# Patient Record
Sex: Female | Born: 1990 | Race: Black or African American | Hispanic: No | Marital: Single
Health system: Southern US, Community
[De-identification: ages and names within clinical notes are randomized; demographics above are authoritative.]

---

## 2010-05-11 ENCOUNTER — Ambulatory Visit (HOSPITAL_COMMUNITY): Admission: RE | Admit: 2010-05-11 | Discharge: 2010-05-11 | Payer: Self-pay | Admitting: Obstetrics and Gynecology

## 2010-10-25 IMAGING — US US OB DETAIL+14 WK
1 series · 14 of 28 positions shown · non-contrast
Comparison: none

OBSTETRICAL ULTRASOUND:
 This ultrasound was performed in The [HOSPITAL], and the [facility] report will be stored to [REDACTED] PACS.  This report is also available in [HOSPITAL]?s accessANYware.

[Series 1: us ob detail+14 wk · 14 of 96 slices shown]
[im 4/96]
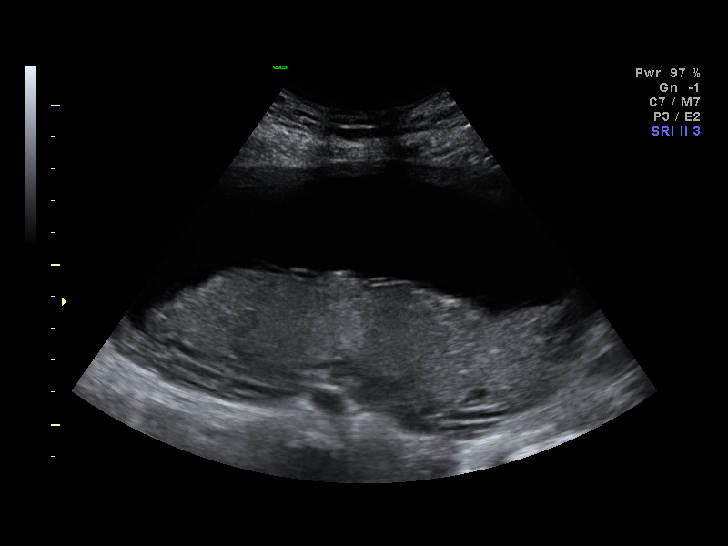
[im 11/96]
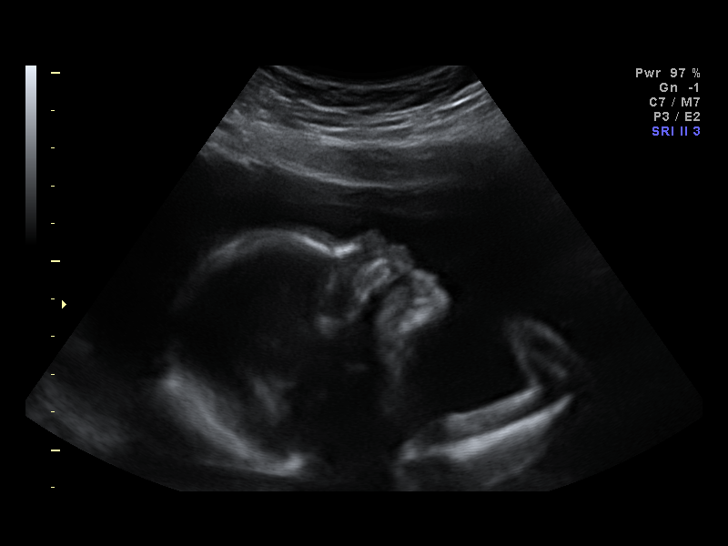
[im 18/96]
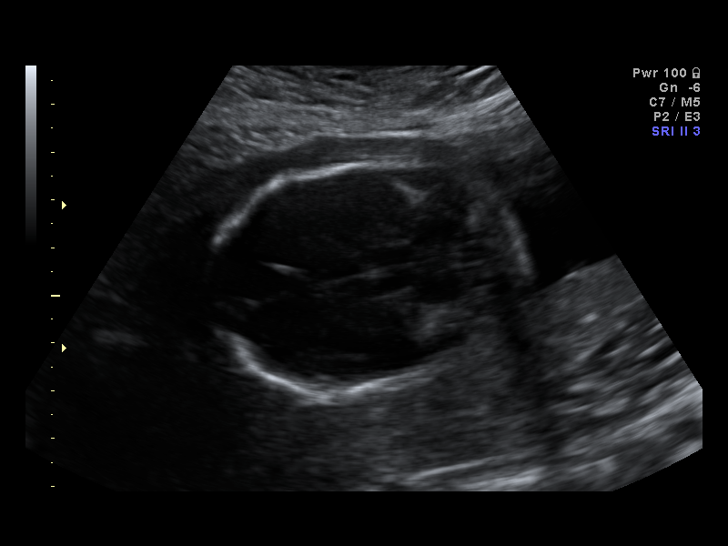
[im 25/96]
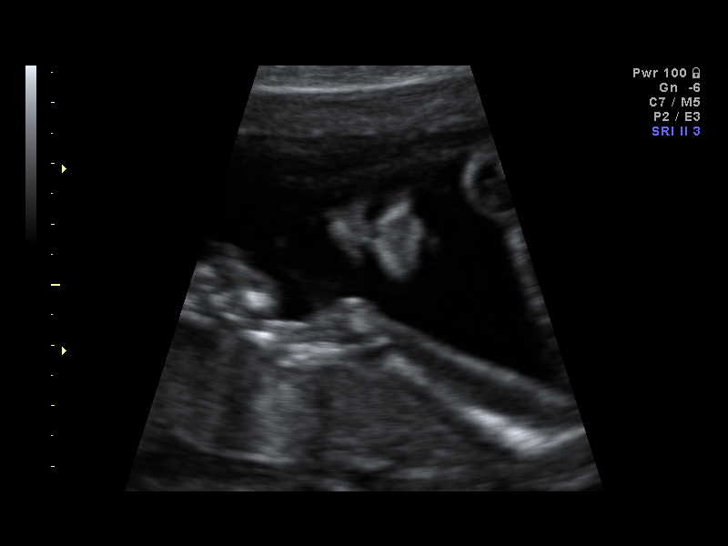
[im 32/96]
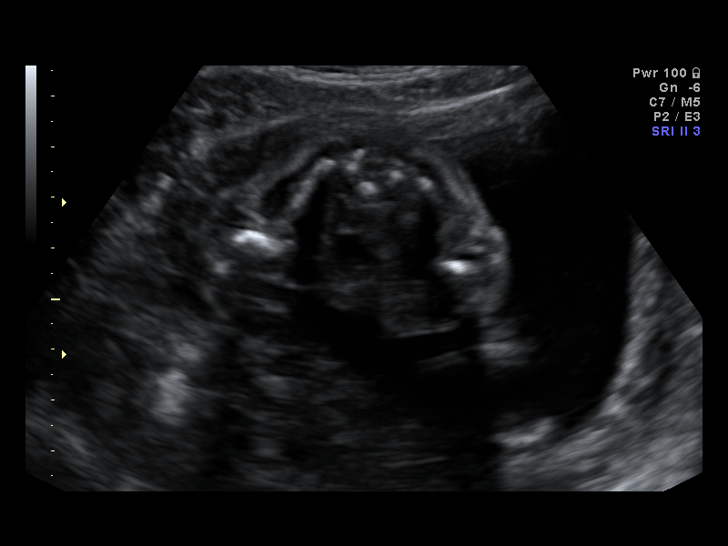
[im 39/96]
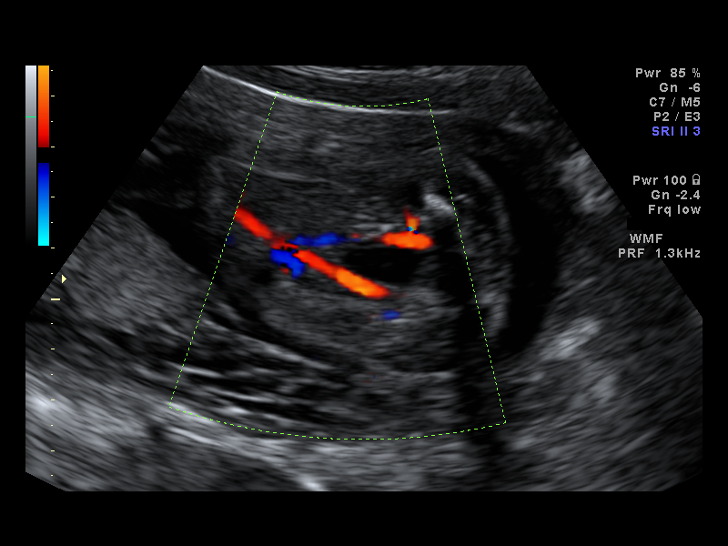
[im 46/96]
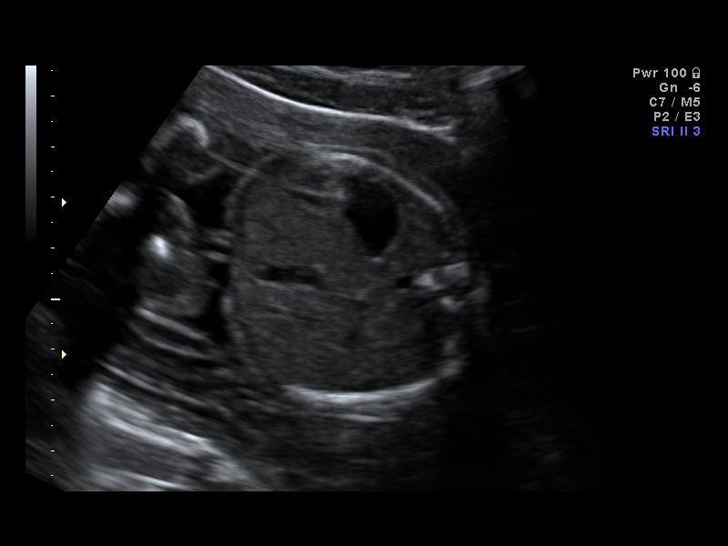
[im 53/96]
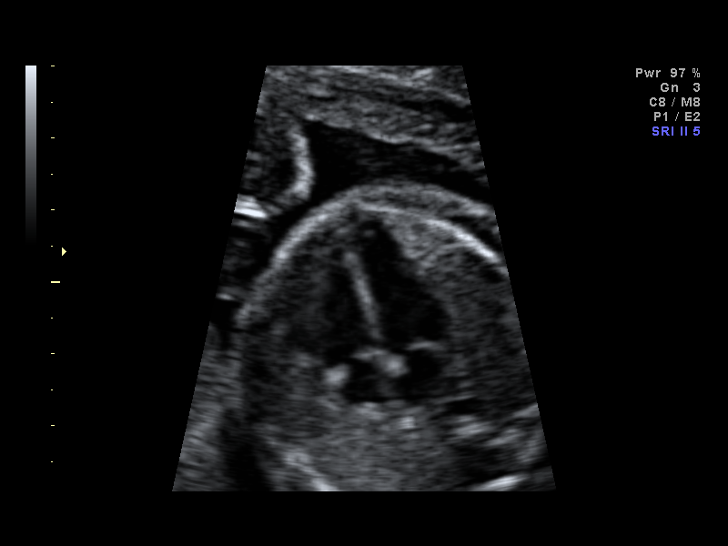
[im 60/96]
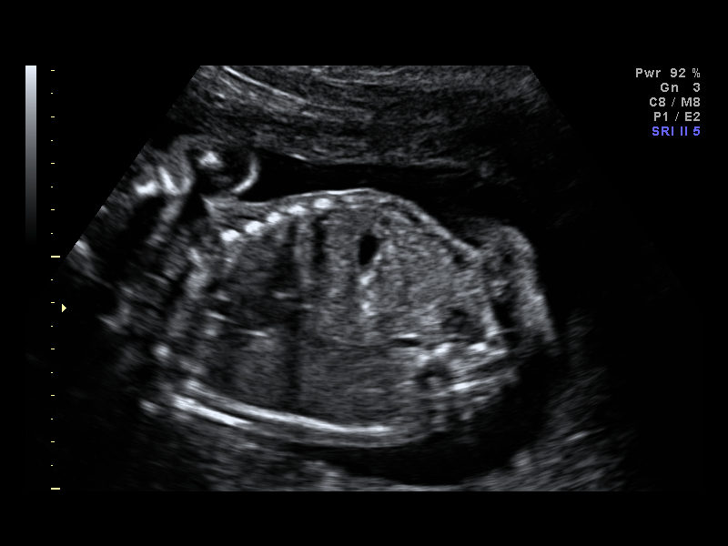
[im 67/96]
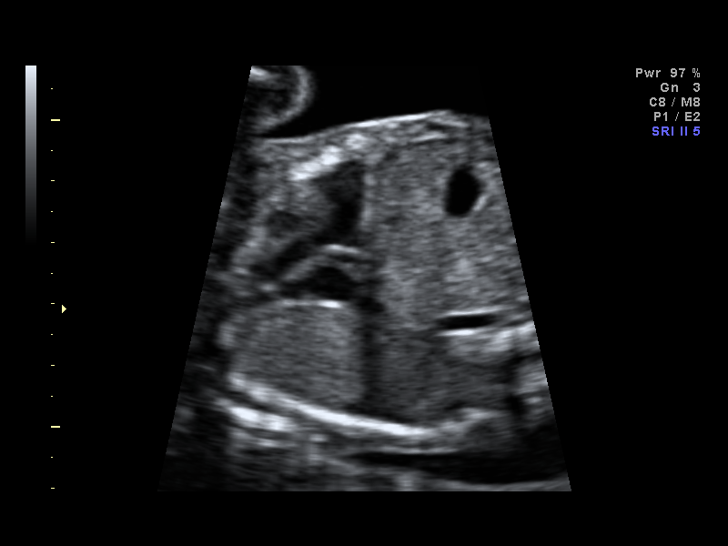
[im 74/96]
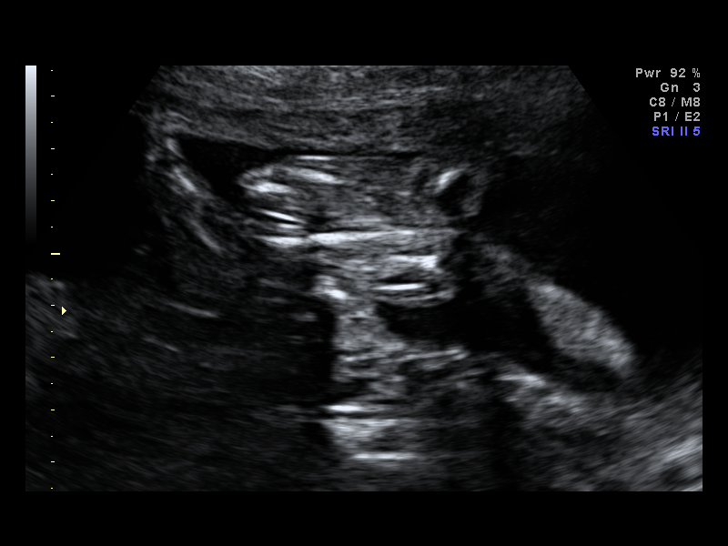
[im 81/96]
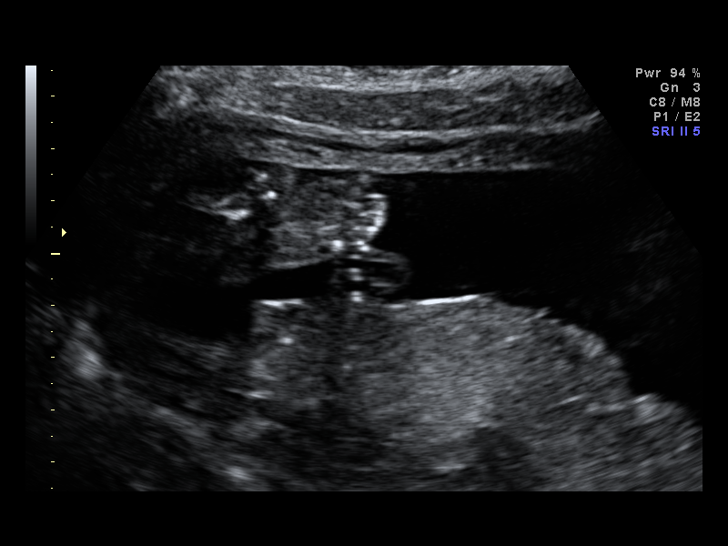
[im 88/96]
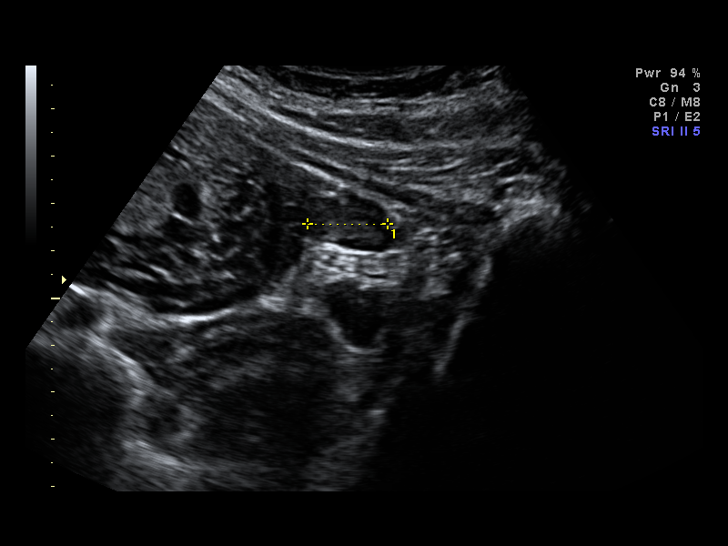
[im 96/96]
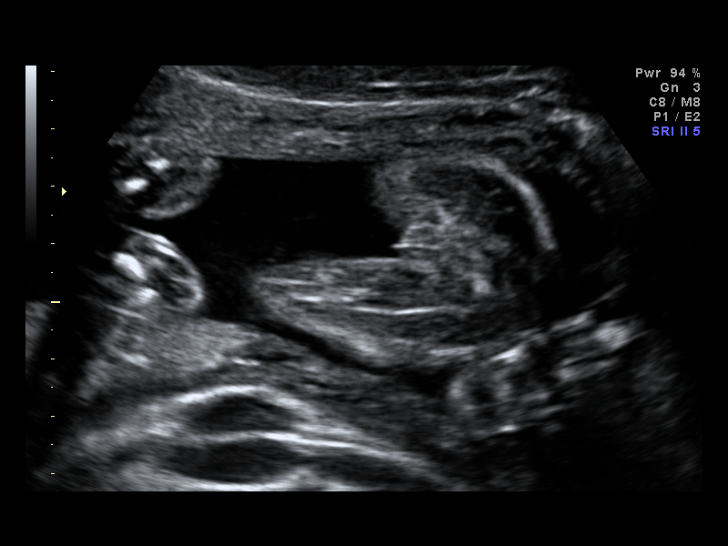

[14 of 28 positions shown; findings below may reference images not displayed]

IMPRESSION: [facility] has also been faxed to the ordering physician.

## 2010-12-16 ENCOUNTER — Encounter: Payer: Self-pay | Admitting: Obstetrics and Gynecology

## 2014-02-03 ENCOUNTER — Inpatient Hospital Stay (HOSPITAL_COMMUNITY)
Admission: AD | Admit: 2014-02-03 | Discharge: 2014-02-03 | Disposition: A | Discharge status: Home / Self Care | Payer: Medicaid Other | Source: Ambulatory Visit | Attending: Obstetrics & Gynecology | Admitting: Obstetrics & Gynecology

## 2022-10-06 ENCOUNTER — Emergency Department (HOSPITAL_BASED_OUTPATIENT_CLINIC_OR_DEPARTMENT_OTHER)
Admission: EM | Admit: 2022-10-06 | Discharge: 2022-10-06 | Disposition: A | Discharge status: Home / Self Care | Payer: Medicaid Other | Attending: Emergency Medicine | Admitting: Emergency Medicine

## 2022-10-06 ENCOUNTER — Other Ambulatory Visit: Payer: Self-pay

## 2022-10-06 ENCOUNTER — Emergency Department (HOSPITAL_BASED_OUTPATIENT_CLINIC_OR_DEPARTMENT_OTHER): Payer: Medicaid Other

## 2022-10-06 ENCOUNTER — Encounter (HOSPITAL_BASED_OUTPATIENT_CLINIC_OR_DEPARTMENT_OTHER): Payer: Self-pay | Admitting: Pediatrics

## 2022-10-06 DIAGNOSIS — S199XXA Unspecified injury of neck, initial encounter: Secondary | ICD-10-CM | POA: Diagnosis present

## 2022-10-06 DIAGNOSIS — S161XXA Strain of muscle, fascia and tendon at neck level, initial encounter: Secondary | ICD-10-CM | POA: Insufficient documentation

## 2022-10-06 DIAGNOSIS — R0781 Pleurodynia: Secondary | ICD-10-CM | POA: Insufficient documentation

## 2022-10-06 DIAGNOSIS — M25512 Pain in left shoulder: Secondary | ICD-10-CM | POA: Insufficient documentation

## 2022-10-06 DIAGNOSIS — Y92410 Unspecified street and highway as the place of occurrence of the external cause: Secondary | ICD-10-CM | POA: Diagnosis not present

## 2022-10-06 MED ORDER — METHOCARBAMOL 500 MG PO TABS: 500.0000 mg | ORAL_TABLET | Freq: Two times a day (BID) | ORAL | 0 refills | Status: AC

## 2022-10-06 MED ORDER — IBUPROFEN 800 MG PO TABS
800.0000 mg | ORAL_TABLET | Freq: Once | ORAL | Status: AC
  Administered 2022-10-06: 800 mg via ORAL
  Filled 2022-10-06: qty 1

## 2022-10-06 MED ORDER — ACETAMINOPHEN 325 MG PO TABS
650.0000 mg | ORAL_TABLET | Freq: Once | ORAL | Status: AC
  Administered 2022-10-06: 650 mg via ORAL
  Filled 2022-10-06: qty 2
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
## 2022-10-06 NOTE — ED Notes (Signed)
Mom states that that her neck hurts and her left side. Denies hitting head. Pain 10/10
